# Patient Record
Sex: Male | Born: 1945 | Race: White | Hispanic: No | Smoking: Former smoker
Health system: Southern US, Community
[De-identification: ages and names within clinical notes are randomized; demographics above are authoritative.]

## PROBLEM LIST (undated history)

## (undated) DIAGNOSIS — C61 Malignant neoplasm of prostate: Secondary | ICD-10-CM

## (undated) DIAGNOSIS — E119 Type 2 diabetes mellitus without complications: Secondary | ICD-10-CM

## (undated) DIAGNOSIS — I10 Essential (primary) hypertension: Secondary | ICD-10-CM

---

## 2004-06-19 ENCOUNTER — Emergency Department: Payer: Self-pay | Admitting: Emergency Medicine

## 2004-06-19 ENCOUNTER — Emergency Department: Payer: Self-pay | Admitting: Otolaryngology

## 2004-06-19 ENCOUNTER — Inpatient Hospital Stay: Payer: Self-pay | Admitting: Otolaryngology

## 2015-05-14 ENCOUNTER — Inpatient Hospital Stay
Admission: EM | Admit: 2015-05-14 | Discharge: 2015-05-16 | DRG: 571 | Disposition: A | Discharge status: Home / Self Care | Payer: PRIVATE HEALTH INSURANCE | Attending: Internal Medicine | Admitting: Internal Medicine

## 2015-05-14 ENCOUNTER — Encounter: Payer: Self-pay | Admitting: Emergency Medicine

## 2015-05-14 ENCOUNTER — Emergency Department: Payer: PRIVATE HEALTH INSURANCE

## 2015-05-14 ENCOUNTER — Ambulatory Visit
Admission: EM | Admit: 2015-05-14 | Discharge: 2015-05-14 | Disposition: A | Discharge status: Other Acute Inpatient Hospital | Payer: PRIVATE HEALTH INSURANCE

## 2015-05-14 DIAGNOSIS — L03115 Cellulitis of right lower limb: Principal | ICD-10-CM | POA: Diagnosis present

## 2015-05-14 DIAGNOSIS — L97509 Non-pressure chronic ulcer of other part of unspecified foot with unspecified severity: Secondary | ICD-10-CM | POA: Diagnosis not present

## 2015-05-14 DIAGNOSIS — E11628 Type 2 diabetes mellitus with other skin complications: Secondary | ICD-10-CM

## 2015-05-14 DIAGNOSIS — E118 Type 2 diabetes mellitus with unspecified complications: Secondary | ICD-10-CM

## 2015-05-14 DIAGNOSIS — B951 Streptococcus, group B, as the cause of diseases classified elsewhere: Secondary | ICD-10-CM | POA: Diagnosis present

## 2015-05-14 DIAGNOSIS — E11621 Type 2 diabetes mellitus with foot ulcer: Secondary | ICD-10-CM | POA: Diagnosis not present

## 2015-05-14 DIAGNOSIS — I10 Essential (primary) hypertension: Secondary | ICD-10-CM | POA: Diagnosis present

## 2015-05-14 DIAGNOSIS — Z79899 Other long term (current) drug therapy: Secondary | ICD-10-CM | POA: Diagnosis not present

## 2015-05-14 DIAGNOSIS — E13628 Other specified diabetes mellitus with other skin complications: Secondary | ICD-10-CM | POA: Diagnosis present

## 2015-05-14 DIAGNOSIS — L02611 Cutaneous abscess of right foot: Secondary | ICD-10-CM | POA: Diagnosis present

## 2015-05-14 DIAGNOSIS — L03119 Cellulitis of unspecified part of limb: Secondary | ICD-10-CM

## 2015-05-14 DIAGNOSIS — S91309A Unspecified open wound, unspecified foot, initial encounter: Secondary | ICD-10-CM

## 2015-05-14 DIAGNOSIS — M2010 Hallux valgus (acquired), unspecified foot: Secondary | ICD-10-CM | POA: Diagnosis present

## 2015-05-14 DIAGNOSIS — E119 Type 2 diabetes mellitus without complications: Secondary | ICD-10-CM | POA: Diagnosis present

## 2015-05-14 DIAGNOSIS — Z8546 Personal history of malignant neoplasm of prostate: Secondary | ICD-10-CM

## 2015-05-14 DIAGNOSIS — L03031 Cellulitis of right toe: Secondary | ICD-10-CM

## 2015-05-14 DIAGNOSIS — I891 Lymphangitis: Secondary | ICD-10-CM | POA: Diagnosis present

## 2015-05-14 DIAGNOSIS — Z87891 Personal history of nicotine dependence: Secondary | ICD-10-CM

## 2015-05-14 DIAGNOSIS — L539 Erythematous condition, unspecified: Secondary | ICD-10-CM

## 2015-05-14 DIAGNOSIS — L02619 Cutaneous abscess of unspecified foot: Secondary | ICD-10-CM

## 2015-05-14 HISTORY — DX: Malignant neoplasm of prostate: C61

## 2015-05-14 HISTORY — DX: Essential (primary) hypertension: I10

## 2015-05-14 HISTORY — DX: Type 2 diabetes mellitus without complications: E11.9

## 2015-05-14 LAB — CBC WITH DIFFERENTIAL/PLATELET
BASOS ABS: 0.1 10*3/uL (ref 0–0.1)
BASOS PCT: 1 %
EOS ABS: 0.2 10*3/uL (ref 0–0.7)
EOS PCT: 2 %
HCT: 42 % (ref 40.0–52.0)
Hemoglobin: 14.7 g/dL (ref 13.0–18.0)
Lymphocytes Relative: 21 %
Lymphs Abs: 1.8 10*3/uL (ref 1.0–3.6)
MCH: 34.5 pg — AB (ref 26.0–34.0)
MCHC: 35 g/dL (ref 32.0–36.0)
MCV: 98.4 fL (ref 80.0–100.0)
Monocytes Absolute: 0.9 10*3/uL (ref 0.2–1.0)
Monocytes Relative: 11 %
Neutro Abs: 5.7 10*3/uL (ref 1.4–6.5)
Neutrophils Relative %: 65 %
PLATELETS: 217 10*3/uL (ref 150–440)
RBC: 4.27 MIL/uL — AB (ref 4.40–5.90)
RDW: 12.2 % (ref 11.5–14.5)
WBC: 8.6 10*3/uL (ref 3.8–10.6)

## 2015-05-14 LAB — BASIC METABOLIC PANEL
ANION GAP: 10 (ref 5–15)
BUN: 8 mg/dL (ref 6–20)
CO2: 27 mmol/L (ref 22–32)
Calcium: 9.5 mg/dL (ref 8.9–10.3)
Chloride: 100 mmol/L — ABNORMAL LOW (ref 101–111)
Creatinine, Ser: 0.6 mg/dL — ABNORMAL LOW (ref 0.61–1.24)
GFR calc non Af Amer: >60 mL/min (ref >60)
Glucose, Bld: 170 mg/dL — ABNORMAL HIGH (ref 65–99)
POTASSIUM: 4.6 mmol/L (ref 3.5–5.1)
SODIUM: 137 mmol/L (ref 135–145)

## 2015-05-14 LAB — PROTIME-INR
INR: 1.08
Prothrombin Time: 14.2 seconds (ref 11.4–15.0)

## 2015-05-14 LAB — GLUCOSE, CAPILLARY: GLUCOSE-CAPILLARY: 176 mg/dL — AB (ref 65–99)

## 2015-05-14 LAB — HEMOGLOBIN A1C: Hgb A1c MFr Bld: 5.8 % (ref 4.0–6.0)

## 2015-05-14 LAB — APTT: APTT: 28 s (ref 24–36)

## 2015-05-14 MED ORDER — AMLODIPINE BESYLATE 10 MG PO TABS
10.0000 mg | ORAL_TABLET | Freq: Every day | ORAL | Status: DC
  Administered 2015-05-14: 10 mg via ORAL
  Filled 2015-05-14: qty 1

## 2015-05-14 MED ORDER — ONDANSETRON HCL 4 MG/2ML IJ SOLN
4.0000 mg | Freq: Four times a day (QID) | INTRAMUSCULAR | Status: DC | PRN
Start: 2015-05-14 — End: 2015-05-16

## 2015-05-14 MED ORDER — BISACODYL 10 MG RE SUPP: 10.0000 mg | Freq: Every day | RECTAL | Status: DC | PRN

## 2015-05-14 MED ORDER — VANCOMYCIN HCL 10 G IV SOLR
1500.0000 mg | Freq: Once | INTRAVENOUS | Status: AC
  Administered 2015-05-14: 1500 mg via INTRAVENOUS
  Filled 2015-05-14: qty 1500

## 2015-05-14 MED ORDER — IOHEXOL 300 MG/ML  SOLN
100.0000 mL | Freq: Once | INTRAMUSCULAR | Status: AC | PRN
  Administered 2015-05-14: 100 mL via INTRAVENOUS

## 2015-05-14 MED ORDER — HEPARIN SODIUM (PORCINE) 5000 UNIT/ML IJ SOLN
5000.0000 [IU] | Freq: Three times a day (TID) | INTRAMUSCULAR | Status: DC
  Administered 2015-05-14 – 2015-05-16 (×5): 5000 [IU] via SUBCUTANEOUS
  Filled 2015-05-14 (×5): qty 1

## 2015-05-14 MED ORDER — CLINDAMYCIN PHOSPHATE 600 MG/50ML IV SOLN
600.0000 mg | Freq: Once | INTRAVENOUS | Status: AC
  Administered 2015-05-14: 600 mg via INTRAVENOUS
  Filled 2015-05-14: qty 50

## 2015-05-14 MED ORDER — PIPERACILLIN-TAZOBACTAM 3.375 G IVPB
3.3750 g | Freq: Once | INTRAVENOUS | Status: DC
  Administered 2015-05-14: 3.375 g via INTRAVENOUS
  Filled 2015-05-14: qty 50

## 2015-05-14 MED ORDER — ACETAMINOPHEN 325 MG PO TABS: 650.0000 mg | ORAL_TABLET | Freq: Four times a day (QID) | ORAL | Status: DC | PRN

## 2015-05-14 MED ORDER — BENAZEPRIL HCL 20 MG PO TABS
40.0000 mg | ORAL_TABLET | Freq: Every day | ORAL | Status: DC
Start: 2015-05-14 — End: 2015-05-14

## 2015-05-14 MED ORDER — INSULIN ASPART 100 UNIT/ML ~~LOC~~ SOLN
0.0000 [IU] | Freq: Three times a day (TID) | SUBCUTANEOUS | Status: DC
  Administered 2015-05-15: 2 [IU] via SUBCUTANEOUS
  Filled 2015-05-14: qty 2

## 2015-05-14 MED ORDER — HYDROCODONE-ACETAMINOPHEN 5-325 MG PO TABS: 1.0000 | ORAL_TABLET | ORAL | Status: DC | PRN

## 2015-05-14 MED ORDER — CARVEDILOL 25 MG PO TABS
25.0000 mg | ORAL_TABLET | Freq: Two times a day (BID) | ORAL | Status: DC
  Administered 2015-05-14 – 2015-05-16 (×4): 25 mg via ORAL
  Filled 2015-05-14 (×4): qty 1

## 2015-05-14 MED ORDER — VANCOMYCIN HCL 10 G IV SOLR
1250.0000 mg | Freq: Three times a day (TID) | INTRAVENOUS | Status: DC
  Administered 2015-05-15: 1250 mg via INTRAVENOUS
  Filled 2015-05-14 (×4): qty 1250

## 2015-05-14 MED ORDER — MORPHINE SULFATE (PF) 2 MG/ML IV SOLN: 2.0000 mg | INTRAVENOUS | Status: DC | PRN

## 2015-05-14 MED ORDER — ASPIRIN EC 81 MG PO TBEC
81.0000 mg | DELAYED_RELEASE_TABLET | Freq: Every day | ORAL | Status: DC
  Administered 2015-05-15 – 2015-05-16 (×2): 81 mg via ORAL
  Filled 2015-05-14 (×2): qty 1

## 2015-05-14 MED ORDER — METFORMIN HCL 500 MG PO TABS
500.0000 mg | ORAL_TABLET | Freq: Two times a day (BID) | ORAL | Status: DC
  Administered 2015-05-14 – 2015-05-16 (×4): 500 mg via ORAL
  Filled 2015-05-14 (×4): qty 1

## 2015-05-14 MED ORDER — PIPERACILLIN-TAZOBACTAM 3.375 G IVPB
3.3750 g | Freq: Three times a day (TID) | INTRAVENOUS | Status: DC
  Administered 2015-05-14 – 2015-05-16 (×5): 3.375 g via INTRAVENOUS
  Filled 2015-05-14 (×7): qty 50

## 2015-05-14 MED ORDER — HYDRALAZINE HCL 50 MG PO TABS
50.0000 mg | ORAL_TABLET | Freq: Three times a day (TID) | ORAL | Status: DC
  Administered 2015-05-14: 50 mg via ORAL
  Filled 2015-05-14 (×2): qty 1

## 2015-05-14 MED ORDER — ONDANSETRON HCL 4 MG PO TABS: 4.0000 mg | ORAL_TABLET | Freq: Four times a day (QID) | ORAL | Status: DC | PRN

## 2015-05-14 MED ORDER — GLIPIZIDE 5 MG PO TABS
10.0000 mg | ORAL_TABLET | Freq: Every day | ORAL | Status: DC
  Administered 2015-05-15 – 2015-05-16 (×2): 10 mg via ORAL
  Filled 2015-05-14 (×2): qty 2

## 2015-05-14 MED ORDER — SODIUM CHLORIDE 0.9 % IV SOLN
INTRAVENOUS | Status: DC
  Administered 2015-05-14 – 2015-05-15 (×2): via INTRAVENOUS

## 2015-05-14 MED ORDER — DOCUSATE SODIUM 100 MG PO CAPS
100.0000 mg | ORAL_CAPSULE | Freq: Two times a day (BID) | ORAL | Status: DC
  Administered 2015-05-14 – 2015-05-16 (×4): 100 mg via ORAL
  Filled 2015-05-14 (×4): qty 1

## 2015-05-14 MED ORDER — ACETAMINOPHEN 650 MG RE SUPP: 650.0000 mg | Freq: Four times a day (QID) | RECTAL | Status: DC | PRN

## 2015-05-14 NOTE — Progress Notes (Signed)
Patient states that "he doesn't take aspirin anymore". Called pharmacy to have it d/c'd, also pt would like to have his glipizide in the morning. Corene Cornea in [place] said he would take care of patient's medication requests.

## 2015-05-14 NOTE — ED Provider Notes (Signed)
CSN: [id number]     Arrival date & time 05/14/15  [id number] History   None   Nurses notes were reviewed. Chief Complaint  Patient presents with  . Toe Pain   Patient is a 69 year old diabetic who comes in because of pain in the right middle toe. He states the foot and toe he noticed were red last night. He states he removed a piece of his toenail off the toe this morning that was digging into the toe. His daughter is here and she states that yesterday was the first time she had heard that he had a problem with the toe and the foot. She states she saw was red but has gotten worse since last night. He is a diabetic and is on metformin. States that he does see his doctor about every 4 months. He has also history of prostate cancer as well.    (Consider location/radiation/quality/duration/timing/severity/associated sxs/prior Treatment) Patient is a 69 y.o. male presenting with toe pain. The history is provided by the patient. No language interpreter was used.  Toe Pain This is a new problem. The current episode started yesterday. The problem occurs constantly. The problem has been rapidly worsening. Pertinent negatives include no chest pain, no abdominal pain and no headaches. Nothing relieves the symptoms. He has tried nothing (Self removal of the toenail was going into the toe) for the symptoms. The treatment provided no relief.    Past Medical History  Diagnosis Date  . Diabetes ([place])   . Prostate cancer ([place])    No past surgical history on file. No family history on file. Social History  Substance Use Topics  . Smoking status: Not on file  . Smokeless tobacco: Not on file  . Alcohol Use: Not on file    Review of Systems  Unable to perform ROS: Acuity of condition  Cardiovascular: Negative for chest pain.  Gastrointestinal: Negative for abdominal pain.  Neurological: Negative for headaches.    Allergies  Review of patient's allergies indicates no known allergies.  Home Medications    Prior to Admission medications   Medication Sig Start Date End Date Taking? Authorizing Provider  metformin (FORTAMET) 1000 MG (OSM) 24 hr tablet Take 1,000 mg by mouth daily with breakfast.   Yes Historical Provider, MD   Meds Ordered and Administered this Visit  Medications - No data to display  There were no vitals taken for this visit. No data found.   Physical Exam  Constitutional: He appears well-developed and well-nourished. No distress.  HENT:  Head: Normocephalic and atraumatic.  Eyes: Conjunctivae are normal. Pupils are equal, round, and reactive to light.  Musculoskeletal: Normal range of motion. He exhibits edema and tenderness.       Right foot: There is tenderness and swelling.       Feet:  There is ulcerations on the right middle toe at the distal part of the nail bed. This pus draining from the area the right toe was markedly swollen and edematous and redness extends from the toe into the foot streaks go up the right leg to the knee. Patient is afebrile but obvious bad toe infection with subsequent cellulitis  Neurological: He is alert.  Skin: He is not diaphoretic. There is erythema.  Psychiatric: He has a normal mood and affect. His behavior is normal.    ED Course  Procedures (including critical care time)  Labs Review Labs Reviewed - No data to display  Imaging Review No results found.   Visual Acuity Review  Right Eye Distance:   Left Eye Distance:   Bilateral Distance:    Right Eye Near:   Left Eye Near:    Bilateral Near:         MDM   1. Cellulitis of middle toe, right   2. Cellulitis of leg, right   3. Type 2 diabetes mellitus with foot ulcer, without [place]-term current use of insulin ([place])     I've discussed with [facility] charge nurse Elenore Rota of patient's imminent arrival. I've informed patient and his daughter that he will probably need blood cultures at least 1 dose of IV antibiotics if not multiple and may require hospitalization. We  did offer to see start the evaluation here but I did also explain to them was a good chance that he was to be sent to the ED. They've opted to go on to the ED     Frederich Cha, MD 05/14/15 1143

## 2015-05-14 NOTE — ED Notes (Signed)
Notified Pharmacy of vancomycin; Will send to ER

## 2015-05-14 NOTE — ED Notes (Signed)
Patient complains of right foot 2nd toe pain. Patient toe is red/purple discoloration. He states that he didn't realize his toe was this bad til last night. He states that toe is painful. Erythema has spread up foot and leg.

## 2015-05-14 NOTE — ED Notes (Signed)
Pt transported to CT ?

## 2015-05-14 NOTE — Consult Note (Signed)
ORTHOPAEDIC CONSULTATION  REQUESTING PHYSICIAN: Hillary Bow, MD  Chief Complaint: Cellulitis right foot with abscess right second toe  HPI: Cory Anderson is a 69 y.o. male who complains of  infection to his right leg. Yesterday he noticed redness extending up his leg. He believes he had an ingrown nail he pulled some of the nail and skin off the tip of the toe today. Was seen in urgent care today and subsequently referred to the emergency room. A time the noted lymphangitis and abscess of the tip of the second toe. He has approximately a 20 year history  diabetes.  Past Medical History  Diagnosis Date  . Diabetes ([place])   . Hypertension   . Prostate cancer [facility])    History reviewed. No pertinent past surgical history. Social History   Social History  . Marital Status: Widowed    Spouse Name: N/A  . Number of Children: N/A  . Years of Education: N/A   Social History Main Topics  . Smoking status: Former Research scientist (life sciences)  . Smokeless tobacco: None  . Alcohol Use: Yes  . Drug Use: None  . Sexual Activity: Not Asked   Other Topics Concern  . None   Social History Narrative   History reviewed. No pertinent family history. No Known Allergies Prior to Admission medications   Medication Sig Start Date End Date Taking? Authorizing Provider  amLODipine (NORVASC) 10 MG tablet Take 10 mg by mouth daily.   Yes Historical Provider, MD  benazepril (LOTENSIN) 40 MG tablet Take 40 mg by mouth daily.   Yes Historical Provider, MD  carvedilol (COREG) 25 MG tablet Take 25 mg by mouth 2 (two) times daily with a meal.   Yes Historical Provider, MD  glipiZIDE (GLUCOTROL) 10 MG tablet Take 10 mg by mouth daily before breakfast.   Yes Historical Provider, MD  hydrALAZINE (APRESOLINE) 50 MG tablet Take 50 mg by mouth 3 (three) times daily.   Yes Historical Provider, MD  metFORMIN (GLUCOPHAGE) 500 MG tablet Take 500 mg by mouth 2 (two) times daily with a meal.   Yes Historical Provider, MD  metformin  (FORTAMET) 1000 MG (OSM) 24 hr tablet Take 1,000 mg by mouth daily with breakfast.    Historical Provider, MD   Ct Tibia Fibula Right W Contrast  05/14/2015  CLINICAL DATA:  Redness. EXAM: CT OF THE LOWER RIGHT EXTREMITY WITH CONTRAST TECHNIQUE: Multidetector CT imaging of the right lower leg was performed according to the standard protocol following intravenous contrast administration. COMPARISON:  None. CONTRAST:  141mL OMNIPAQUE IOHEXOL 300 MG/ML  SOLN FINDINGS: Vascular calcifications are noted. No fracture or dislocation is noted. No lytic destruction is noted to suggest osteomyelitis. Degenerative changes are seen involving the first metatarsophalangeal joint. No abnormal fluid collection is noted. Mild stranding of subcutaneous tissues is noted concerning for edema or inflammation. IMPRESSION: No fracture or dislocation is seen involving the right tibia or fibula. No lytic destruction is seen to suggest osteomyelitis. Mild stranding of subcutaneous tissues in distal lower leg are noted concerning for edema or inflammation. Electronically Signed   By: Marijo Conception, M.D.   On: 05/14/2015 15:21   Dg Foot Complete Right  05/14/2015  CLINICAL DATA:  Ulcerated wound second toe. Pain and redness distal foot EXAM: RIGHT FOOT COMPLETE - 3+ VIEW COMPARISON:  None. FINDINGS: Frontal, oblique, and lateral views were obtained. There is no demonstrable fracture or dislocation. There is no erosive change or bony destruction. No soft tissue abscess is appreciable. There  is bony overgrowth of the distal first metatarsal with bunion formation. There is hallux valgus deformity at the first MTP joint. There is osteoarthritic change in the first MTP joint. There is also osteoarthritic change in the medial cuneiform -first metatarsal joint. There is mild spurring in the dorsal midfoot. There are spurs arising from the posterior inferior calcaneus. 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 Hallux valgus deformity at the first MTP joint with bunion formation in this area. No fracture or dislocation. No erosive change or bony destruction. No demonstrable soft tissue abscess. There is soft tissue swelling over the dorsal forefoot. Electronically Signed   By: Lowella Grip III M.D.   On: 05/14/2015 13:18    Positive ROS: All other systems have been reviewed and were otherwise negative with the exception of those mentioned in the HPI and as above.  12 point ROS was performed.  Physical Exam: General: Alert and oriented.  No apparent distress.  Vascular:  Left foot:Dorsalis Pedis:  present Posterior Tibial:  present  Right foot: Dorsalis Pedis:  present Posterior Tibial:  present  Neuro:absent protective sensation. His station is absent to the toes. Intact From the mid foot proximal.  Derm: The tip of the right second toe has a noted open ulceration on the very end of it. Thick fibrotic tissue on the tip of the second toe. There is a scant amount of purulent drainage from this area as well as a smaller open abscess area dorsally on the second toe. There is noted erythema diffusely to the second toe with cellulitis dorsally to the midfoot. Lymphangitis is noted to the medial aspect of the calf to the mid tibial region on the right side. The ulcerative site itself does probe directly to bone. There is no loose bone within the area. I was able to debride with a 15 blade through the epidermis and dermal layer down to subcutaneous tissue. I did not debride the bone. This was an excisional type of debridement. Surgery itself measures 3 mm in diameter. It was thickened nonviable fibrotic tissue in the central aspect.  Ortho/MS: Edema is noted to the right forefoot. Valgus deformity noted. No severe hammertoe contracture of the second toe on the right foot.  X-rays: I reviewed the x-rays that shows no obvious erosive  changes.  CBC Latest Ref Rng 05/14/2015  WBC 3.8 - 10.6 K/uL 8.6  Hemoglobin 13.0 - 18.0 g/dL 14.7  Hematocrit 40.0 - 52.0 % 42.0  Platelets 150 - 440 K/uL 217    Lab Results  Component Value Date   HGBA1C 5.8 05/14/2015      Assessment: Abscess tip of the right second toe. Concerning for osteomyelitis with no x-ray erosive changes.  Plan: Excisional debridement was performed as stated above. Wound culture was also taken.  Applied a bandaged to the area. We'll await cultures. Suspect he can be monitored outpatient once cultures and sensitivities are obtained. I did discuss with the patient he is at high risk of osteomyelitis given the fact he has a draining abscessed area that probes to bone. As time there is no radiographic findings of osteomyelitis on either CT or x-ray imaging.    Elesa Hacker, DPM Cell [phone]   05/14/2015 7:59 PM

## 2015-05-14 NOTE — Discharge Instructions (Signed)
Cellulitis °Cellulitis is an infection of the skin and the tissue under the skin. The infected area is usually red and tender. This happens most often in the arms and lower legs. °HOME CARE  °· Take your antibiotic medicine as told. Finish the medicine even if you start to feel better. °· Keep the infected arm or leg raised (elevated). °· Put a warm cloth on the area up to 4 times per day. °· Only take medicines as told by your doctor. °· Keep all doctor visits as told. °GET HELP IF: °· You see red streaks on the skin coming from the infected area. °· Your red area gets bigger or turns a dark color. °· Your bone or joint under the infected area is painful after the skin heals. °· Your infection comes back in the same area or different area. °· You have a puffy (swollen) bump in the infected area. °· You have new symptoms. °· You have a fever. °GET HELP RIGHT AWAY IF:  °· You feel very sleepy. °· You throw up (vomit) or have watery poop (diarrhea). °· You feel sick and have muscle aches and pains. °  °This information is not intended to replace advice given to you by your health care provider. Make sure you discuss any questions you have with your health care provider. °  °Document Released: 11/06/2007 Document Revised: 02/08/2015 Document Reviewed: 08/05/2011 °Elsevier Interactive Patient Education ©2016 [employer] ° °

## 2015-05-14 NOTE — H&P (Signed)
History and Physical    Cory Anderson [id number] DOB: 24-Nov-1945 DOA: 05/14/2015  Referring physician: Dr. Jacqualine Code PCP: [facility]  Specialists: none  Chief Complaint: right foot pain  HPI: Cory Anderson is a 69 y.o. male has a past medical history significant for HTN, DM, and prostate CA who presents with progressive right foot/toe pain. Has known DM. In ER, pt noted to have blistering wound to right second toe with erythema tracking up the right leg. CT suggests cellulitis without osteomyelitis. He is now admitted.  Review of Systems: The patient denies anorexia, fever, weight loss,, vision loss, decreased hearing, hoarseness, chest pain, syncope, dyspnea on exertion, peripheral edema, balance deficits, hemoptysis, abdominal pain, melena, hematochezia, severe indigestion/heartburn, hematuria, incontinence, genital sores, muscle weakness, suspicious skin lesions, transient blindness, difficulty walking, depression, unusual weight change, abnormal bleeding, enlarged lymph nodes, angioedema, and breast masses.   Past Medical History  Diagnosis Date  . Diabetes ([place])   . Hypertension   . Prostate cancer [facility])    History reviewed. No pertinent past surgical history. Social History:  reports that he has quit smoking. He does not have any smokeless tobacco history on file. He reports that he drinks alcohol. His drug history is not on file.  No Known Allergies  History reviewed. No pertinent family history.  Prior to Admission medications   Medication Sig Start Date End Date Taking? Authorizing Provider  amLODipine (NORVASC) 10 MG tablet Take 10 mg by mouth daily.   Yes Historical Provider, MD  benazepril (LOTENSIN) 40 MG tablet Take 40 mg by mouth daily.   Yes Historical Provider, MD  carvedilol (COREG) 25 MG tablet Take 25 mg by mouth 2 (two) times daily with a meal.   Yes Historical Provider, MD  glipiZIDE (GLUCOTROL) 10 MG tablet Take 10 mg by mouth daily  before breakfast.   Yes Historical Provider, MD  hydrALAZINE (APRESOLINE) 50 MG tablet Take 50 mg by mouth 3 (three) times daily.   Yes Historical Provider, MD  metFORMIN (GLUCOPHAGE) 500 MG tablet Take 500 mg by mouth 2 (two) times daily with a meal.   Yes Historical Provider, MD  metformin (FORTAMET) 1000 MG (OSM) 24 hr tablet Take 1,000 mg by mouth daily with breakfast.    Historical Provider, MD   Physical Exam: Filed Vitals:   05/14/15 1400 05/14/15 1430 05/14/15 1445 05/14/15 1500  BP: 119/68 125/76  124/66  Pulse: 89 34 59 86  Temp:      TempSrc:      Resp:      Height:      Weight:      SpO2: 96% 97% 95% 97%     General:  No apparent distress  Eyes: PERRL, EOMI, no scleral icterus  ENT: moist oropharynx  Neck: supple, no lymphadenopathy  Cardiovascular: regular rate without MRG; 2+ peripheral pulses, no JVD, no peripheral edema  Respiratory: CTA biL, good air movement without wheezing, rhonchi or crackled  Abdomen: soft, non tender to palpation, positive bowel sounds, no guarding, no rebound  Skin: blistering wound to right second toe with purulence and erythema and tenderness. Erythema extends up right leg to 2 inches below knee.  Musculoskeletal: normal bulk and tone, no joint swelling  Psychiatric: normal mood and affect  Neurologic: CN 2-12 grossly intact, MS 5/5 in all 4  Labs on Admission:  Basic Metabolic Panel:  Recent Labs Lab 05/14/15 1220  NA 137  K 4.6  CL 100*  CO2 27  GLUCOSE  170*  BUN 8  CREATININE 0.60*  CALCIUM 9.5   Liver Function Tests: No results for input(s): AST, ALT, ALKPHOS, BILITOT, PROT, ALBUMIN in the last 168 hours. No results for input(s): LIPASE, AMYLASE in the last 168 hours. No results for input(s): AMMONIA in the last 168 hours. CBC:  Recent Labs Lab 05/14/15 1220  WBC 8.6  NEUTROABS 5.7  HGB 14.7  HCT 42.0  MCV 98.4  PLT 217   Cardiac Enzymes: No results for input(s): CKTOTAL, CKMB, CKMBINDEX, TROPONINI  in the last 168 hours.  BNP (last 3 results) No results for input(s): BNP in the last 8760 hours.  ProBNP (last 3 results) No results for input(s): PROBNP in the last 8760 hours.  CBG: No results for input(s): GLUCAP in the last 168 hours.  Radiological Exams on Admission: Ct Tibia Fibula Right W Contrast  05/14/2015  CLINICAL DATA:  Redness. EXAM: CT OF THE LOWER RIGHT EXTREMITY WITH CONTRAST TECHNIQUE: Multidetector CT imaging of the right lower leg was performed according to the standard protocol following intravenous contrast administration. COMPARISON:  None. CONTRAST:  154mL OMNIPAQUE IOHEXOL 300 MG/ML  SOLN FINDINGS: Vascular calcifications are noted. No fracture or dislocation is noted. No lytic destruction is noted to suggest osteomyelitis. Degenerative changes are seen involving the first metatarsophalangeal joint. No abnormal fluid collection is noted. Mild stranding of subcutaneous tissues is noted concerning for edema or inflammation. IMPRESSION: No fracture or dislocation is seen involving the right tibia or fibula. No lytic destruction is seen to suggest osteomyelitis. Mild stranding of subcutaneous tissues in distal lower leg are noted concerning for edema or inflammation. Electronically Signed   By: Marijo Conception, M.D.   On: 05/14/2015 15:21   Dg Foot Complete Right  05/14/2015  CLINICAL DATA:  Ulcerated wound second toe. Pain and redness distal foot EXAM: RIGHT FOOT COMPLETE - 3+ VIEW COMPARISON:  None. FINDINGS: Frontal, oblique, and lateral views were obtained. There is no demonstrable fracture or dislocation. There is no erosive change or bony destruction. No soft tissue abscess is appreciable. There is bony overgrowth of the distal first metatarsal with bunion formation. There is hallux valgus deformity at the first MTP joint. There is osteoarthritic change in the first MTP joint. There is also osteoarthritic change in the medial cuneiform -first metatarsal joint. There is  mild spurring in the dorsal midfoot. There are spurs arising from the posterior inferior calcaneus. 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 Hallux valgus deformity at the first MTP joint with bunion formation in this area. No fracture or dislocation. No erosive change or bony destruction. No demonstrable soft tissue abscess. There is soft tissue swelling over the dorsal forefoot. Electronically Signed   By: Lowella Grip III M.D.   On: 05/14/2015 13:18    EKG: Independently reviewed.  Assessment/Plan Principal Problem:   Cellulitis and abscess of foot Active Problems:   Wound, open, foot   Type II diabetes mellitus with manifestations ([place])   Cultures sent. Will admit to floor with IV fluids and IV ABX. Follow sugars. Consult Podiatry.  Diet: Carb modified Fluids: NS@100  DVT Prophylaxis: SQ Heparin  Code Status: FULL  Family Communication: yes  Disposition Plan: home  Time spent: 50 min

## 2015-05-14 NOTE — ED Notes (Signed)
Pt to ED with c/o pain, redness, ulcerated wound and swelling to right 2nd toe x 1 week, states he got worse last night, also has redness going up right leg, pt is a diabetic, states he thinks this started as a ingrown toenail

## 2015-05-14 NOTE — ED Provider Notes (Signed)
[facility] Emergency Department Provider Note REMINDER - THIS NOTE IS NOT A FINAL MEDICAL RECORD UNTIL IT IS SIGNED. UNTIL THEN, THE CONTENT BELOW MAY REFLECT INFORMATION FROM A DOCUMENTATION TEMPLATE, NOT THE ACTUAL PATIENT VISIT. ____________________________________________  Time seen: Approximately 1:33 PM  I have reviewed the triage vital signs and the nursing notes.   HISTORY  Chief Complaint Foot Pain and Cellulitis    HPI Cory Anderson is a 69 y.o. male presents for evaluation of right foot swelling. Patient reported for about the last week he had some small ulcer at the tip of his right second toe, however yesterday started no severe increase in redness which has today now tracked up to the mid leg. He denies pain, does status some mild tingling in his toes due to his diabetes.  No fevers or chills. No chest pain or trouble breathing. Denies any other concerns.   Past Medical History  Diagnosis Date  . Diabetes ([place])   . Prostate cancer ([place])   . Hypertension     There are no active problems to display for this patient.   History reviewed. No pertinent past surgical history.  Current Outpatient Rx  Name  Route  Sig  Dispense  Refill  . amLODipine (NORVASC) 10 MG tablet   Oral   Take 10 mg by mouth daily.         . benazepril (LOTENSIN) 40 MG tablet   Oral   Take 40 mg by mouth daily.         . carvedilol (COREG) 25 MG tablet   Oral   Take 25 mg by mouth 2 (two) times daily with a meal.         . glipiZIDE (GLUCOTROL) 10 MG tablet   Oral   Take 10 mg by mouth daily before breakfast.         . hydrALAZINE (APRESOLINE) 50 MG tablet   Oral   Take 50 mg by mouth 3 (three) times daily.         . metFORMIN (GLUCOPHAGE) 500 MG tablet   Oral   Take 500 mg by mouth 2 (two) times daily with a meal.         . metformin (FORTAMET) 1000 MG (OSM) 24 hr tablet   Oral   Take 1,000 mg by mouth daily with breakfast.            Allergies Review of patient's allergies indicates no known allergies.  No family history on file.  Social History Social History  Substance Use Topics  . Smoking status: Former Research scientist (life sciences)  . Smokeless tobacco: None  . Alcohol Use: Yes    Review of Systems Constitutional: No fever/chills Eyes: No visual changes. ENT: No sore throat. Cardiovascular: Denies chest pain. Respiratory: Denies shortness of breath. Gastrointestinal: No abdominal pain.  No nausea, no vomiting.  No diarrhea.  No constipation. Genitourinary: Negative for dysuria. Musculoskeletal: Negative for back pain. Skin: See history of present illness Neurological: Negative for headaches, focal weakness or numbness.  10-point ROS otherwise negative.  ____________________________________________   PHYSICAL EXAM:  VITAL SIGNS: ED Triage Vitals  Enc Vitals Group     BP 05/14/15 1209 124/58 mmHg     Pulse Rate 05/14/15 1209 86     Resp 05/14/15 1209 18     Temp 05/14/15 1209 97.5 F (36.4 C)     Temp Source 05/14/15 1209 Oral     SpO2 05/14/15 1209 99 %     Weight  05/14/15 1215 184 lb (83.462 kg)     Height 05/14/15 1215 5\' 11"  (1.803 m)     Head Cir --      Peak Flow --      Pain Score 05/14/15 1217 0     Pain Loc --      Pain Edu? --      Excl. in [place]? --    Constitutional: Alert and oriented. Well appearing and in no acute distress. Eyes: Conjunctivae are normal. PERRL. EOMI. Head: Atraumatic. Nose: No congestion/rhinnorhea. Mouth/Throat: Mucous membranes are moist.  Oropharynx non-erythematous. Neck: No stridor.   Cardiovascular: Normal rate, regular rhythm. Grossly normal heart sounds.  Good peripheral circulation. Respiratory: Normal respiratory effort.  No retractions. Lungs CTAB. Gastrointestinal: Soft and nontender. No distention. No abdominal bruits. No CVA tenderness. Musculoskeletal:   Lower Extremities  No edema. Normal DP/PT pulses bilateral with good cap refill.  Normal  neuro-motor function lower extremities bilateral.  RIGHT Right lower extremity demonstrates normal strength, good use of all muscles. No edema bruising or contusions of the right hip, right knee, right ankle. Full range of motion of the right lower extremity without pain. No pain on axial loading. No evidence of trauma.  Patient does have notable erythema and edema of the entire right second phalanx, in addition there is red streaking and erythema from approximately the toe into the mid  and then up to the mid right calf. Normal dorsalis pedis and posterior tibial pulses bilaterally  LEFT Left lower extremity demonstrates normal strength, good use of all muscles. No edema bruising or contusions of the hip,  knee, ankle. Full range of motion of the left lower extremity without pain. No pain on axial loading. No evidence of trauma.   Neurologic:  Normal speech and language. No gross focal neurologic deficits are appreciated. No gait instability. Skin:  Skin is warm, dry and intact. No rash noted. Psychiatric: Mood and affect are normal. Speech and behavior are normal.  ____________________________________________   LABS (all labs ordered are listed, but only abnormal results are displayed)  Labs Reviewed  CBC WITH DIFFERENTIAL/PLATELET - Abnormal; Notable for the following:    RBC 4.27 (*)    MCH 34.5 (*)    All other components within normal limits  BASIC METABOLIC PANEL - Abnormal; Notable for the following:    Chloride 100 (*)    Glucose, Bld 170 (*)    Creatinine, Ser 0.60 (*)    All other components within normal limits  CULTURE, BLOOD (ROUTINE X 2)  CULTURE, BLOOD (ROUTINE X 2)  PROTIME-INR  APTT   ____________________________________________  EKG   ____________________________________________  RADIOLOGY  CT Tibia Fibula Right W Contrast (Final result) Result time: 05/14/15 15:21:27   Procedure changed from CT EXTREM LOWER W CM BIL      Final result by Rad  Results In Interface (05/14/15 15:21:27)   Narrative:   CLINICAL DATA: Redness.  EXAM: CT OF THE LOWER RIGHT EXTREMITY WITH CONTRAST  TECHNIQUE: Multidetector CT imaging of the right lower leg was performed according to the standard protocol following intravenous contrast administration.  COMPARISON: None.  CONTRAST: 181mL OMNIPAQUE IOHEXOL 300 MG/ML SOLN  FINDINGS: Vascular calcifications are noted. No fracture or dislocation is noted. No lytic destruction is noted to suggest osteomyelitis. Degenerative changes are seen involving the first metatarsophalangeal joint. No abnormal fluid collection is noted. Mild stranding of subcutaneous tissues is noted concerning for edema or inflammation.  IMPRESSION: No fracture or dislocation is seen involving the right  tibia or fibula. No lytic destruction is seen to suggest osteomyelitis. Mild stranding of subcutaneous tissues in distal lower leg are noted concerning for edema or inflammation.    ____________________________________________   PROCEDURES  Procedure(s) performed: None  Critical Care performed: No  ____________________________________________   INITIAL IMPRESSION / ASSESSMENT AND PLAN / ED COURSE  Pertinent labs & imaging results that were available during my care of the patient were reviewed by me and considered in my medical decision making (see chart for details).  Patient presents with what appears to be severe cellulitis of the right foot, he is a known diabetic. He reports severe increase in streaking and redness her last 24 hours. I am concerned with his exam about the possibility of abscess or free air/anaerobic infection. We'll admit the patient, place him on broad-spectrum IV antibiotics, and obtain CT imaging to evaluate for gas forming organisms. ____________________________________________   FINAL CLINICAL IMPRESSION(S) / ED DIAGNOSES  Final diagnoses:  Redness  Cellulitis in diabetic foot  (HCC)      Delman Kitten, MD 05/14/15 1529

## 2015-05-14 NOTE — Progress Notes (Signed)
ANTIBIOTIC CONSULT NOTE - INITIAL  Pharmacy Consult for Vancomycin & Zosyn Indication: Cellulitis  No Known Allergies  Patient Measurements: Height: 5\' 11"  (180.3 cm) Weight: 184 lb (83.462 kg) IBW/kg (Calculated) : 75.3   Vital Signs: Temp: 97.5 F (36.4 C) (12/11 1215) Temp Source: Oral (12/11 1215) BP: 117/74 mmHg (12/11 1630) Pulse Rate: 73 (12/11 1630) Intake/Output from previous day:   Intake/Output from this shift:    Labs:  Recent Labs  05/14/15 1220  WBC 8.6  HGB 14.7  PLT 217  CREATININE 0.60*   Estimated Creatinine Clearance: 94.1 mL/min (by C-G formula based on Cr of 0.6). No results for input(s): VANCOTROUGH, VANCOPEAK, VANCORANDOM, GENTTROUGH, GENTPEAK, GENTRANDOM, TOBRATROUGH, TOBRAPEAK, TOBRARND, AMIKACINPEAK, AMIKACINTROU, AMIKACIN in the last 72 hours.   Microbiology: No results found for this or any previous visit (from the past 720 hour(s)).  Medical History: Past Medical History  Diagnosis Date  . Diabetes ([place])   . Hypertension   . Prostate cancer ([place])     Medications:  Anti-infectives    Start     Dose/Rate Route Frequency Ordered Stop   05/15/15 0200  vancomycin (VANCOCIN) 1,250 mg in sodium chloride 0.9 % 250 mL IVPB     1,250 mg 166.7 mL/hr over 90 Minutes Intravenous Every 8 hours 05/14/15 1643     05/14/15 2300  piperacillin-tazobactam (ZOSYN) IVPB 3.375 g     3.375 g 12.5 mL/hr over 240 Minutes Intravenous 3 times per day 05/14/15 1643     05/14/15 1345  piperacillin-tazobactam (ZOSYN) IVPB 3.375 g     3.375 g 12.5 mL/hr over 240 Minutes Intravenous  Once 05/14/15 1339     05/14/15 1300  clindamycin (CLEOCIN) IVPB 600 mg     600 mg 100 mL/hr over 30 Minutes Intravenous  Once 05/14/15 1245 05/14/15 1405   05/14/15 1300  vancomycin (VANCOCIN) 1,500 mg in sodium chloride 0.9 % 500 mL IVPB     1,500 mg 250 mL/hr over 120 Minutes Intravenous  Once 05/14/15 1245 05/14/15 1645     Assessment: 69 yo male admitted with  progressive right foot/toe pain with blistering wound to right second toe and erythema tracking up the right leg. HX significant for Diabetes and Hypertension  Goal of Therapy:  Vancomycin trough level 10-15 mcg/ml  Eradication of infection  Plan:  Patient received 1500mg  Vancomcyin in ED today.  Will follow with 1250mg  q12 hr and check trough before 4th dose.  Zosyn 3.375mg  EI ordered q8 hr per renal function.   Shaneka Efaw K 05/14/2015,4:45 PM

## 2015-05-14 NOTE — ED Notes (Signed)
Patient is being sent to [facility] by personal vehicle. Patient may require IV Antibiotics, which we cannot provide here. Patient is being transported by daughter. Dr. Frederich Cha MD advised that hospital evaluation is best option for patient at this current time.

## 2015-05-15 LAB — CBC
HCT: 38.1 % — ABNORMAL LOW (ref 40.0–52.0)
HEMOGLOBIN: 13.5 g/dL (ref 13.0–18.0)
MCH: 34.5 pg — ABNORMAL HIGH (ref 26.0–34.0)
MCHC: 35.3 g/dL (ref 32.0–36.0)
MCV: 97.7 fL (ref 80.0–100.0)
Platelets: 201 10*3/uL (ref 150–440)
RBC: 3.9 MIL/uL — AB (ref 4.40–5.90)
RDW: 12.4 % (ref 11.5–14.5)
WBC: 6.3 10*3/uL (ref 3.8–10.6)

## 2015-05-15 LAB — COMPREHENSIVE METABOLIC PANEL
ALBUMIN: 3.2 g/dL — AB (ref 3.5–5.0)
ALK PHOS: 87 U/L (ref 38–126)
ALT: 11 U/L — AB (ref 17–63)
AST: 17 U/L (ref 15–41)
Anion gap: 7 (ref 5–15)
BUN: 8 mg/dL (ref 6–20)
CALCIUM: 8.8 mg/dL — AB (ref 8.9–10.3)
CO2: 27 mmol/L (ref 22–32)
CREATININE: 0.6 mg/dL — AB (ref 0.61–1.24)
Chloride: 106 mmol/L (ref 101–111)
GFR calc Af Amer: >60 mL/min (ref >60)
GFR calc non Af Amer: >60 mL/min (ref >60)
GLUCOSE: 67 mg/dL (ref 65–99)
Potassium: 3.5 mmol/L (ref 3.5–5.1)
SODIUM: 140 mmol/L (ref 135–145)
Total Bilirubin: 1.1 mg/dL (ref 0.3–1.2)
Total Protein: 6.5 g/dL (ref 6.5–8.1)

## 2015-05-15 LAB — GLUCOSE, CAPILLARY
GLUCOSE-CAPILLARY: 91 mg/dL (ref 65–99)
GLUCOSE-CAPILLARY: 168 mg/dL — AB (ref 65–99)
Glucose-Capillary: 87 mg/dL (ref 65–99)
Glucose-Capillary: 81 mg/dL (ref 65–99)
Glucose-Capillary: 101 mg/dL — ABNORMAL HIGH (ref 65–99)

## 2015-05-15 MED ORDER — VANCOMYCIN HCL 10 G IV SOLR
1250.0000 mg | Freq: Two times a day (BID) | INTRAVENOUS | Status: DC
  Administered 2015-05-15: 1250 mg via INTRAVENOUS
  Filled 2015-05-15 (×2): qty 1250

## 2015-05-15 NOTE — Progress Notes (Signed)
   05/15/15 1336  Clinical Encounter Type  Visited With Patient  Visit Type Initial  Consult/Referral To Chaplain  Stress Factors  Patient Stress Factors None identified  Chaplain rounded in unit and offered pastoral care.   Chaplain Alexis Reber [phone]

## 2015-05-15 NOTE — Progress Notes (Signed)
ANTIBIOTIC CONSULT NOTE - INITIAL  Pharmacy Consult for Vancomycin & Zosyn Indication: Cellulitis  No Known Allergies  Patient Measurements: Height: 5\' 11"  (180.3 cm) Weight: 180 lb 9.2 oz (81.909 kg) IBW/kg (Calculated) : 75.3  Vital Signs: Temp: 98.5 F (36.9 C) (12/12 0721) Temp Source: Oral (12/12 0721) BP: 126/62 mmHg (12/12 0721) Pulse Rate: 79 (12/12 0721) Intake/Output from previous day:   Intake/Output from this shift:    Labs:  Recent Labs  05/14/15 1220 05/15/15 0330  WBC 8.6 6.3  HGB 14.7 13.5  PLT 217 201  CREATININE 0.60* 0.60*   Estimated Creatinine Clearance: 94.1 mL/min (by C-G formula based on Cr of 0.6). No results for input(s): VANCOTROUGH, VANCOPEAK, VANCORANDOM, GENTTROUGH, GENTPEAK, GENTRANDOM, TOBRATROUGH, TOBRAPEAK, TOBRARND, AMIKACINPEAK, AMIKACINTROU, AMIKACIN in the last 72 hours.   Microbiology: Recent Results (from the past 720 hour(s))  Wound culture     Status: None (Preliminary result)   Collection Time: 05/14/15  7:50 PM  Result Value Ref Range Status   Specimen Description WOUND  Final   Special Requests NONE  Final   Gram Stain PENDING  Incomplete   Culture   Final    HEAVY GROWTH BETA HEMOLYTIC ORGANISM BEING IDENTIFIED There is no known Penicillin Resistant Beta Streptococcus in the [place] For patients that are Penicillin-allergic, Erythromycin is 85-94% susceptible, and Clindamycin is 80% susceptible.  Contact Microbiology within 7 days if sensitivity testing is  required.      Report Status PENDING  Incomplete   Medical History: Past Medical History  Diagnosis Date  . Diabetes ([place])   . Hypertension   . Prostate cancer ([place])    Medications:  Anti-infectives    Start     Dose/Rate Route Frequency Ordered Stop   05/15/15 1430  vancomycin (VANCOCIN) 1,250 mg in sodium chloride 0.9 % 250 mL IVPB     1,250 mg 166.7 mL/hr over 90 Minutes Intravenous Every 12 hours 05/15/15 0936     05/15/15 0200  vancomycin (VANCOCIN)  1,250 mg in sodium chloride 0.9 % 250 mL IVPB  Status:  Discontinued     1,250 mg 166.7 mL/hr over 90 Minutes Intravenous Every 8 hours 05/14/15 1643 05/15/15 0935   05/14/15 2300  piperacillin-tazobactam (ZOSYN) IVPB 3.375 g     3.375 g 12.5 mL/hr over 240 Minutes Intravenous 3 times per day 05/14/15 1643     05/14/15 1345  piperacillin-tazobactam (ZOSYN) IVPB 3.375 g  Status:  Discontinued     3.375 g 12.5 mL/hr over 240 Minutes Intravenous  Once 05/14/15 1339 05/14/15 1701   05/14/15 1300  clindamycin (CLEOCIN) IVPB 600 mg     600 mg 100 mL/hr over 30 Minutes Intravenous  Once 05/14/15 1245 05/14/15 1405   05/14/15 1300  vancomycin (VANCOCIN) 1,500 mg in sodium chloride 0.9 % 500 mL IVPB     1,500 mg 250 mL/hr over 120 Minutes Intravenous  Once 05/14/15 1245 05/14/15 1645     Assessment: Pharmacy consulted to dose vancomycin and piperacillin/tazobactam in this 69 year old male admitted with progressive right foot/toe pain with a blistering wound and erythema tracking up right leg. Pharmacy consulted to dose vancomycin for cellulitis. Patient has a PMH significant for diabetes. Imaging does not suggest osteomyelitis. Patient underwent debridement on 12/11 and wound culture was obtained.  Patient is currently on day #2 antibiotics: -piperacillin/tazobactam 3.375 g IV q8h -vancomycin 1500 mg x 1 dose in ED followed by vancomycin 1250 mg IV q8h  Kinetics: Use actual body weight of 81.9 kg  Ke: 0.082 Half-life: ~13 hours CrCl ~ 100 mL/min Cmin (calculated): ~13 mcg/mL  Goal of Therapy:  Vancomycin trough level 10-15 mcg/ml  Eradication of infection  Plan:  Due to patient's age and calculated half-life, will change vancomycin to 1250 mg IV q12h regimen.  Patient only received one dose of the q 8 hour regimen at 0230 this morning (~ 8 hours after initial vancomycin dose given in ED). Will begin q12h dosing at 14:30 this afternoon. Vancomycin trough ordered for 12/13 at 1400, prior to 5th  dose.  Continue piperacillin/tazobactam 3.375mg  q 8 hours EI.  Pharmacy will continue to monitor, thank you for the consult.  Darylene Price Choice Kleinsasser 05/15/2015,9:41 AM

## 2015-05-15 NOTE — Progress Notes (Signed)
[facility] [place] NAME: Cory Anderson    MR#:  [id number]  DATE OF BIRTH:  [birth year]  SUBJECTIVE:  CHIEF COMPLAINT:   Chief Complaint  Patient presents with  . Foot Pain  . Cellulitis   no complaint. Status post surgery of right foot.  REVIEW OF SYSTEMS:  CONSTITUTIONAL: No fever, fatigue or weakness.  EYES: No blurred or double vision.  EARS, NOSE, AND THROAT: No tinnitus or ear pain.  RESPIRATORY: No cough, shortness of breath, wheezing or hemoptysis.  CARDIOVASCULAR: No chest pain, orthopnea, edema.  GASTROINTESTINAL: No nausea, vomiting, diarrhea or abdominal pain.  GENITOURINARY: No dysuria, hematuria.  ENDOCRINE: No polyuria, nocturia,  HEMATOLOGY: No anemia, easy bruising or bleeding SKIN: No rash or lesion. MUSCULOSKELETAL: No joint pain or arthritis.   NEUROLOGIC: No tingling, numbness, weakness.  PSYCHIATRY: No anxiety or depression.   DRUG ALLERGIES:  No Known Allergies  VITALS:  Blood pressure 131/82, pulse 81, temperature 98.7 F (37.1 C), temperature source Oral, resp. rate 18, height 5\' 11"  (1.803 m), weight 81.909 kg (180 lb 9.2 oz), SpO2 96 %.  PHYSICAL EXAMINATION:  GENERAL:  69 y.o.-year-old patient lying in the bed with no acute distress.  EYES: Pupils equal, round, reactive to light and accommodation. No scleral icterus. Extraocular muscles intact.  HEENT: Head atraumatic, normocephalic. Oropharynx and nasopharynx clear.  NECK:  Supple, no jugular venous distention. No thyroid enlargement, no tenderness.  LUNGS: Normal breath sounds bilaterally, no wheezing, rales,rhonchi or crepitation. No use of accessory muscles of respiration.  CARDIOVASCULAR: S1, S2 normal. No murmurs, rubs, or gallops.  ABDOMEN: Soft, nontender, nondistended. Bowel sounds present. No organomegaly or mass.  EXTREMITIES: No pedal edema, cyanosis, or clubbing.  NEUROLOGIC: Cranial nerves II through XII are intact. Muscle  strength 5/5 in all extremities. Sensation intact. Gait not checked. right second toe in dressing. PSYCHIATRIC: The patient is alert and oriented x 3.  SKIN: No obvious rash, lesion, or ulcer.    LABORATORY PANEL:   CBC  Recent Labs Lab 05/15/15 0330  WBC 6.3  HGB 13.5  HCT 38.1*  PLT 201   ------------------------------------------------------------------------------------------------------------------  Chemistries   Recent Labs Lab 05/15/15 0330  NA 140  K 3.5  CL 106  CO2 27  GLUCOSE 67  BUN 8  CREATININE 0.60*  CALCIUM 8.8*  AST 17  ALT 11*  ALKPHOS 87  BILITOT 1.1   ------------------------------------------------------------------------------------------------------------------  Cardiac Enzymes No results for input(s): TROPONINI in the last 168 hours. ------------------------------------------------------------------------------------------------------------------  RADIOLOGY:  Ct Tibia Fibula Right W Contrast  05/14/2015  CLINICAL DATA:  Redness. EXAM: CT OF THE LOWER RIGHT EXTREMITY WITH CONTRAST TECHNIQUE: Multidetector CT imaging of the right lower leg was performed according to the standard protocol following intravenous contrast administration. COMPARISON:  None. CONTRAST:  158mL OMNIPAQUE IOHEXOL 300 MG/ML  SOLN FINDINGS: Vascular calcifications are noted. No fracture or dislocation is noted. No lytic destruction is noted to suggest osteomyelitis. Degenerative changes are seen involving the first metatarsophalangeal joint. No abnormal fluid collection is noted. Mild stranding of subcutaneous tissues is noted concerning for edema or inflammation. IMPRESSION: No fracture or dislocation is seen involving the right tibia or fibula. No lytic destruction is seen to suggest osteomyelitis. Mild stranding of subcutaneous tissues in distal lower leg are noted concerning for edema or inflammation. Electronically Signed   By: Marijo Conception, M.D.   On: 05/14/2015 15:21    Dg Foot Complete Right  05/14/2015  CLINICAL DATA:  Ulcerated wound second toe. Pain and redness distal foot EXAM: RIGHT FOOT COMPLETE - 3+ VIEW COMPARISON:  None. FINDINGS: Frontal, oblique, and lateral views were obtained. There is no demonstrable fracture or dislocation. There is no erosive change or bony destruction. No soft tissue abscess is appreciable. There is bony overgrowth of the distal first metatarsal with bunion formation. There is hallux valgus deformity at the first MTP joint. There is osteoarthritic change in the first MTP joint. There is also osteoarthritic change in the medial cuneiform -first metatarsal joint. There is mild spurring in the dorsal midfoot. There are spurs arising from the posterior inferior calcaneus. 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 Hallux valgus deformity at the first MTP joint with bunion formation in this area. No fracture or dislocation. No erosive change or bony destruction. No demonstrable soft tissue abscess. There is soft tissue swelling over the dorsal forefoot. Electronically Signed   By: Lowella Grip III M.D.   On: 05/14/2015 13:18    EKG:  No orders found for this or any previous visit.  ASSESSMENT AND PLAN:   Cellulitis and abscess of right second toe. Status post Excisional debridement. Continue Zosyn and discontinue vancomycin. Wound care. As time there is no radiographic findings of osteomyelitis on either CT or x-ray imaging per Dr. Vickki Muff. Follow up wound culture: HEAVY GROWTH GROUP B STREP(S.AGALACTIAE)ISOLATED.  Diabetes. Continue sliding scale. Metformin and glipizide. Hypertension. Continue Coreg.   All the records are reviewed and case discussed with Care Management/Social Workerr. Management plans discussed with the patient, his daughter and they are in agreement.  CODE STATUS: Full code  TOTAL TIME TAKING CARE OF THIS  PATIENT: 37 minutes.  Greater than 50% time was spent on coordination of care and face-to-face counseling.  POSSIBLE D/C IN 2 DAYS, DEPENDING ON CLINICAL CONDITION.   Demetrios Loll M.D on 05/15/2015 at 4:35 PM  Between 7am to 6pm - Pager - [phone]  After 6pm go to [email or web address] - password [email or web address] [place] [facility]  Office  [phone]  CC: Primary care physician; [facility]

## 2015-05-15 NOTE — Progress Notes (Signed)
Wound culture consistent with beta-hemolytic strep.  Suspect can be d/c'd on po antibiotics.  Recommend Augmentin for broad spectrum.  F/U with me in outpt clinic next week.  Dressing can be changed daily with antibiotic ointment and gauze dressing.

## 2015-05-16 LAB — CREATININE, SERUM
CREATININE: 0.74 mg/dL (ref 0.61–1.24)
GFR calc non Af Amer: >60 mL/min (ref >60)

## 2015-05-16 LAB — GLUCOSE, CAPILLARY: GLUCOSE-CAPILLARY: 96 mg/dL (ref 65–99)

## 2015-05-16 MED ORDER — AMOXICILLIN-POT CLAVULANATE 875-125 MG PO TABS: 1.0000 | ORAL_TABLET | Freq: Two times a day (BID) | ORAL | Status: AC

## 2015-05-16 NOTE — Discharge Instructions (Signed)
Heart healthy and ADA diet. Activity as tolerated. Wound care and dressing change.

## 2015-05-16 NOTE — Progress Notes (Signed)
Discharged home; instructions/prescriptions given; acknowledged understanding.

## 2015-05-16 NOTE — Discharge Summary (Signed)
[facility] [place] NAME: Cory Anderson    MR#:  [id number]  DATE OF BIRTH:  February 22, 1946  DATE OF ADMISSION:  05/14/2015 ADMITTING PHYSICIAN: Idelle Crouch, MD  DATE OF DISCHARGE: 05/16/2015  PRIMARY CARE PHYSICIAN: [facility]    ADMISSION DIAGNOSIS:  Cellulitis in diabetic foot ([place]) [E13.628, [id number] Redness [L53.9]   DISCHARGE DIAGNOSIS:   Cellulitis and abscess of right second toe. SECONDARY DIAGNOSIS:   Past Medical History  Diagnosis Date  . Diabetes ([place])   . Hypertension   . Prostate cancer [facility])     HOSPITAL COURSE:   Cellulitis and abscess of right second toe. Status post Excisional debridement. Treated with Zosyn and vancomycin. Wound care. As time there is no radiographic findings of osteomyelitis on either CT or x-ray imaging per Dr. Vickki Muff. Follow up wound culture: HEAVY GROWTH GROUP B STREP(S.AGALACTIAE)ISOLATED. Will change to augmentin for 12 more days. Follow up Dr. Vickki Muff. Diabetes. On sliding scale. continue Metformin and glipizide. Hypertension. Continue hypertension medications.  DISCHARGE CONDITIONS:   Stable, discharge to home today.  CONSULTS OBTAINED:  Treatment Team:  Samara Deist, DPM  DRUG ALLERGIES:  No Known Allergies  DISCHARGE MEDICATIONS:   Current Discharge Medication List    START taking these medications   Details  amoxicillin-clavulanate (AUGMENTIN) 875-125 MG tablet Take 1 tablet by mouth 2 (two) times daily. Qty: 24 tablet, Refills: 0      CONTINUE these medications which have NOT CHANGED   Details  amLODipine (NORVASC) 10 MG tablet Take 10 mg by mouth daily.    benazepril (LOTENSIN) 40 MG tablet Take 40 mg by mouth daily.    carvedilol (COREG) 25 MG tablet Take 25 mg by mouth 2 (two) times daily with a meal.    glipiZIDE (GLUCOTROL) 10 MG tablet Take 10 mg by mouth daily before breakfast.    hydrALAZINE (APRESOLINE) 50 MG tablet Take 50  mg by mouth 3 (three) times daily.    metFORMIN (GLUCOPHAGE) 500 MG tablet Take 500 mg by mouth 2 (two) times daily with a meal.    metformin (FORTAMET) 1000 MG (OSM) 24 hr tablet Take 1,000 mg by mouth daily with breakfast.         DISCHARGE INSTRUCTIONS:    If you experience worsening of your admission symptoms, develop shortness of breath, life threatening emergency, suicidal or homicidal thoughts you must seek medical attention immediately by calling 911 or calling your MD immediately  if symptoms less severe.  You Must read complete instructions/literature along with all the possible adverse reactions/side effects for all the Medicines you take and that have been prescribed to you. Take any new Medicines after you have completely understood and accept all the possible adverse reactions/side effects.   Please note  You were cared for by a hospitalist during your hospital stay. If you have any questions about your discharge medications or the care you received while you were in the hospital after you are discharged, you can call the unit and asked to speak with the hospitalist on call if the hospitalist that took care of you is not available. Once you are discharged, your primary care physician will handle any further medical issues. Please note that NO REFILLS for any discharge medications will be authorized once you are discharged, as it is imperative that you return to your primary care physician (or establish a relationship with a primary care physician if you do not have one) for your  aftercare needs so that they can reassess your need for medications and monitor your lab values.    Today   SUBJECTIVE      VITAL SIGNS:  Blood pressure 145/81, pulse 36, temperature 98.7 F (37.1 C), temperature source Oral, resp. rate 18, height 5\' 11"  (1.803 m), weight 81.874 kg (180 lb 8 oz), SpO2 96 %.  I/O:   Intake/Output Summary (Last 24 hours) at 05/16/15 1006 Last data filed at  05/16/15 0316  Gross per 24 hour  Intake    590 ml  Output    700 ml  Net   -110 ml    PHYSICAL EXAMINATION:  GENERAL:  69 y.o.-year-old patient lying in the bed with no acute distress.  EYES: Pupils equal, round, reactive to light and accommodation. No scleral icterus. Extraocular muscles intact.  HEENT: Head atraumatic, normocephalic. Oropharynx and nasopharynx clear.  NECK:  Supple, no jugular venous distention. No thyroid enlargement, no tenderness.  LUNGS: Normal breath sounds bilaterally, no wheezing, rales,rhonchi or crepitation. No use of accessory muscles of respiration.  CARDIOVASCULAR: S1, S2 normal. No murmurs, rubs, or gallops.  ABDOMEN: Soft, non-tender, non-distended. Bowel sounds present. No organomegaly or mass.  EXTREMITIES: No pedal edema, cyanosis, or clubbing.  Right 2nd toe in dressing. NEUROLOGIC: Cranial nerves II through XII are intact. Muscle strength 5/5 in all extremities. Sensation intact. Gait not checked.  PSYCHIATRIC: The patient is alert and oriented x 3.  SKIN: No obvious rash, lesion, or ulcer.   DATA REVIEW:   CBC  Recent Labs Lab 05/15/15 0330  WBC 6.3  HGB 13.5  HCT 38.1*  PLT 201    Chemistries   Recent Labs Lab 05/15/15 0330 05/16/15 0541  NA 140  --   K 3.5  --   CL 106  --   CO2 27  --   GLUCOSE 67  --   BUN 8  --   CREATININE 0.60* 0.74  CALCIUM 8.8*  --   AST 17  --   ALT 11*  --   ALKPHOS 87  --   BILITOT 1.1  --     Cardiac Enzymes No results for input(s): TROPONINI in the last 168 hours.  Microbiology Results  Results for orders placed or performed during the hospital encounter of 05/14/15  Culture, blood (routine x 2)     Status: None (Preliminary result)   Collection Time: 05/14/15 12:42 PM  Result Value Ref Range Status   Specimen Description BLOOD LEFT WRIST  Final   Special Requests BOTTLES DRAWN AEROBIC AND ANAEROBIC  1CC  Final   Culture NO GROWTH 2 DAYS  Final   Report Status PENDING  Incomplete   Culture, blood (routine x 2)     Status: None (Preliminary result)   Collection Time: 05/14/15 12:50 PM  Result Value Ref Range Status   Specimen Description BLOOD RIGHT WRIST  Final   Special Requests BOTTLES DRAWN AEROBIC AND ANAEROBIC  2CC  Final   Culture NO GROWTH 2 DAYS  Final   Report Status PENDING  Incomplete  Wound culture     Status: None (Preliminary result)   Collection Time: 05/14/15  7:50 PM  Result Value Ref Range Status   Specimen Description WOUND  Final   Special Requests NONE  Final   Gram Stain RARE WBC SEEN FEW GRAM POSITIVE COCCI IN PAIRS   Final   Culture   Final    HEAVY GROWTH GROUP B STREP(S.AGALACTIAE)ISOLATED There is no known Penicillin Resistant Beta  Streptococcus in the [place] For patients that are Penicillin-allergic, Erythromycin is 85-94% susceptible, and Clindamycin is 80% susceptible.  Contact Microbiology within 7 days if sensitivity testing is  required.   Virtually 100% of S. agalactiae (Group B) strains are susceptible to Penicillin.  For Penicillin-allergic patients, Erythromycin (85-95% sensitive) and Clindamycin (80% sensitive) are drugs of choice. Contact microbiology lab to request sensitivities if  needed within 7 days.    Report Status PENDING  Incomplete    RADIOLOGY:  Ct Tibia Fibula Right W Contrast  05/14/2015  CLINICAL DATA:  Redness. EXAM: CT OF THE LOWER RIGHT EXTREMITY WITH CONTRAST TECHNIQUE: Multidetector CT imaging of the right lower leg was performed according to the standard protocol following intravenous contrast administration. COMPARISON:  None. CONTRAST:  149mL OMNIPAQUE IOHEXOL 300 MG/ML  SOLN FINDINGS: Vascular calcifications are noted. No fracture or dislocation is noted. No lytic destruction is noted to suggest osteomyelitis. Degenerative changes are seen involving the first metatarsophalangeal joint. No abnormal fluid collection is noted. Mild stranding of subcutaneous tissues is noted concerning for edema or  inflammation. IMPRESSION: No fracture or dislocation is seen involving the right tibia or fibula. No lytic destruction is seen to suggest osteomyelitis. Mild stranding of subcutaneous tissues in distal lower leg are noted concerning for edema or inflammation. Electronically Signed   By: Marijo Conception, M.D.   On: 05/14/2015 15:21   Dg Foot Complete Right  05/14/2015  CLINICAL DATA:  Ulcerated wound second toe. Pain and redness distal foot EXAM: RIGHT FOOT COMPLETE - 3+ VIEW COMPARISON:  None. FINDINGS: Frontal, oblique, and lateral views were obtained. There is no demonstrable fracture or dislocation. There is no erosive change or bony destruction. No soft tissue abscess is appreciable. There is bony overgrowth of the distal first metatarsal with bunion formation. There is hallux valgus deformity at the first MTP joint. There is osteoarthritic change in the first MTP joint. There is also osteoarthritic change in the medial cuneiform -first metatarsal joint. There is mild spurring in the dorsal midfoot. There are spurs arising from the posterior inferior calcaneus. 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 Hallux valgus deformity at the first MTP joint with bunion formation in this area. No fracture or dislocation. No erosive change or bony destruction. No demonstrable soft tissue abscess. There is soft tissue swelling over the dorsal forefoot. Electronically Signed   By: Lowella Grip III M.D.   On: 05/14/2015 13:18        Management plans discussed with the patient, family and they are in agreement.  CODE STATUS:     Code Status Orders        Start     Ordered   05/14/15 1702  Full code   Continuous     05/14/15 1701      TOTAL TIME TAKING CARE OF THIS PATIENT: 33 minutes.    Demetrios Loll M.D on 05/16/2015 at 10:06 AM  Between 7am to 6pm - Pager - [phone]  After 6pm go to  [email or web address] - password [email or web address] [facility]  Office  [phone]  CC: Primary care physician; [facility]

## 2015-05-17 LAB — WOUND CULTURE

## 2015-05-19 LAB — CULTURE, BLOOD (ROUTINE X 2)
CULTURE: NO GROWTH
CULTURE: NO GROWTH

## 2016-04-20 IMAGING — CT CT TIBIA FIBULA *R* W/ CM
3 of 4 series · 10 of 35 positions shown, 11 images · IV contrast (agent unspecified)
Comparison: None.

CONTRAST:  100mL OMNIPAQUE IOHEXOL 300 MG/ML  SOLN

CLINICAL DATA: Redness.

EXAM:
CT OF THE LOWER RIGHT EXTREMITY WITH CONTRAST
TECHNIQUE: Multidetector CT imaging of the right lower leg was performed
according to the standard protocol following intravenous contrast
administration.

[Series 2: knee · axial · 0.55mm/px · z∈[+229,+476]mm · 2 of 387 slices shown, 3 images]
[im 111/387  soft-tissue]
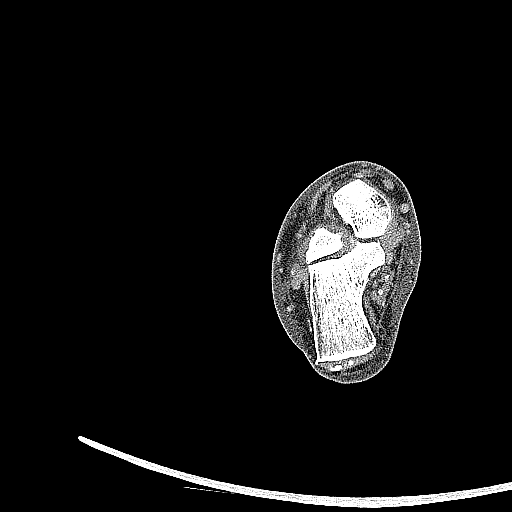
[im 111/387  bone]
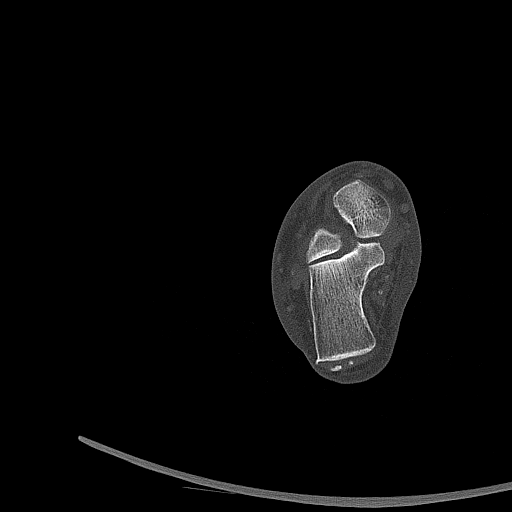
[im 276/387  bone]
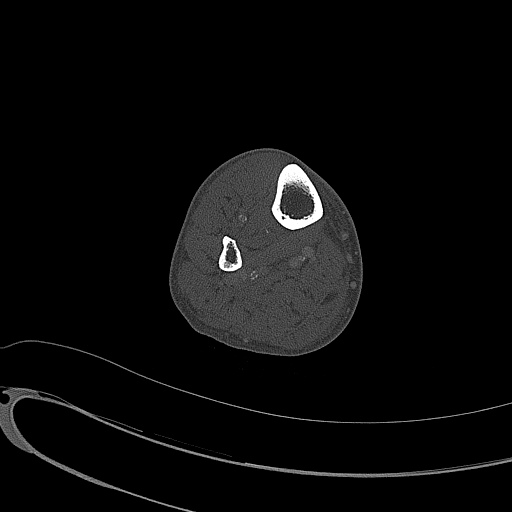

[Series 12: cor st · coronal · 0.46mm/px · 3 of 109 slices shown]
[im 22/109  bone]
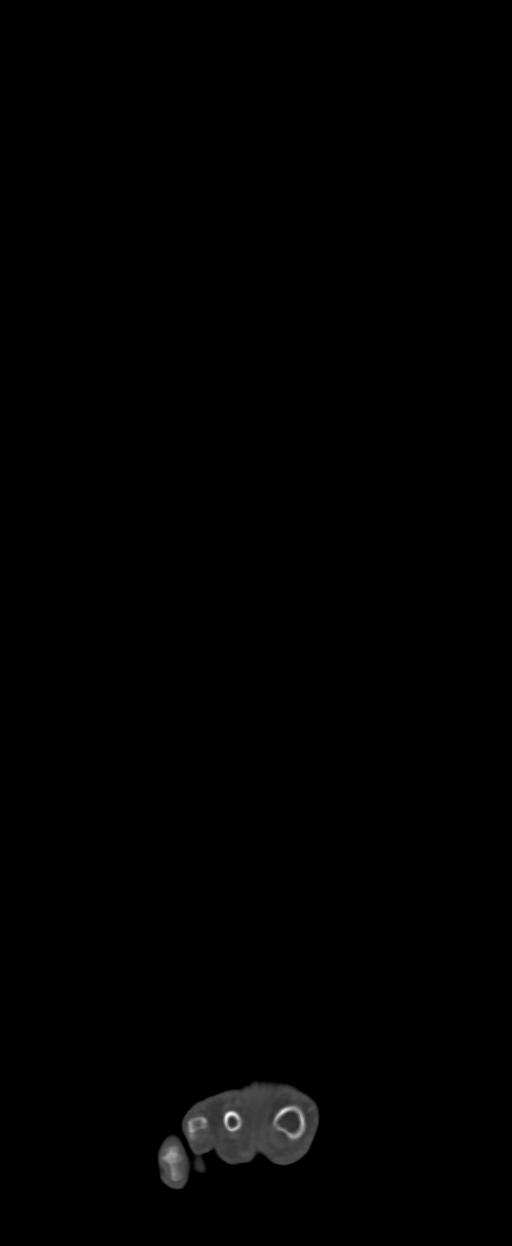
[im 44/109  bone]
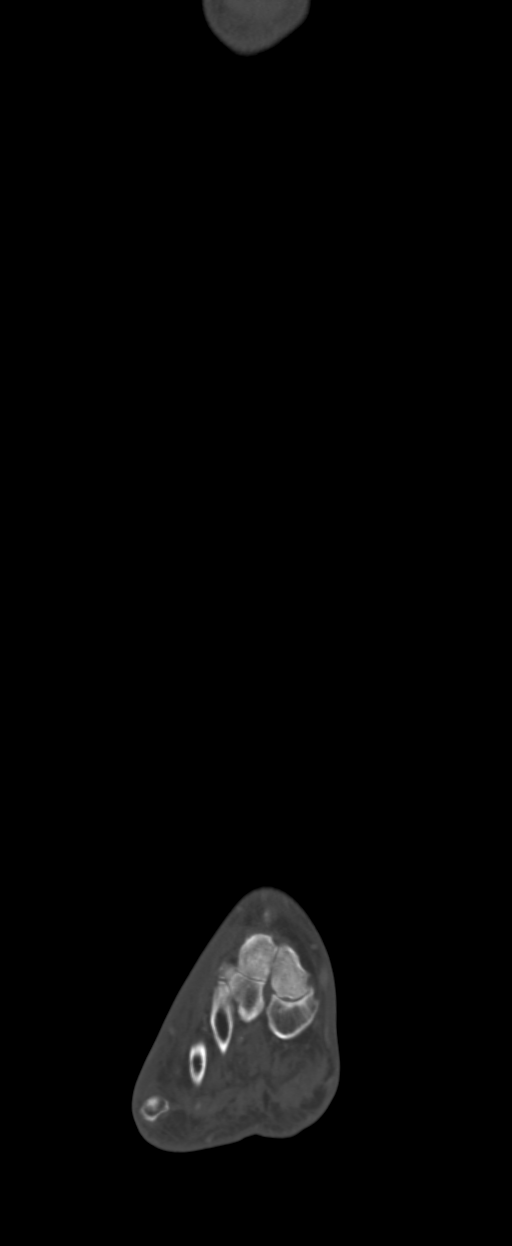
[im 65/109  bone]
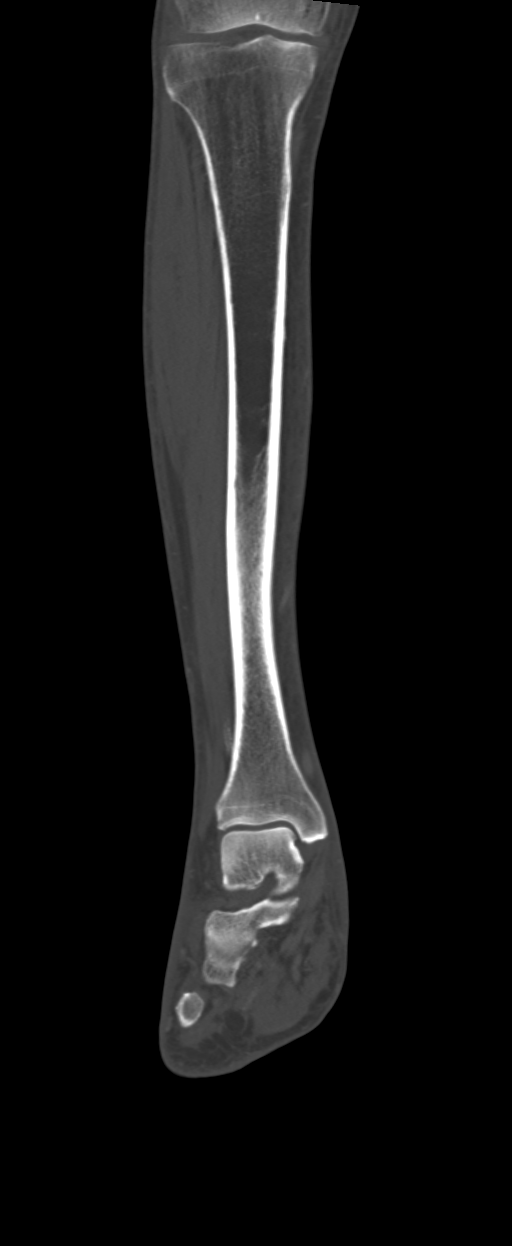

[Series 13: sag st · sagittal · 0.55mm/px · 5 of 56 slices shown]
[im 19/56  bone]
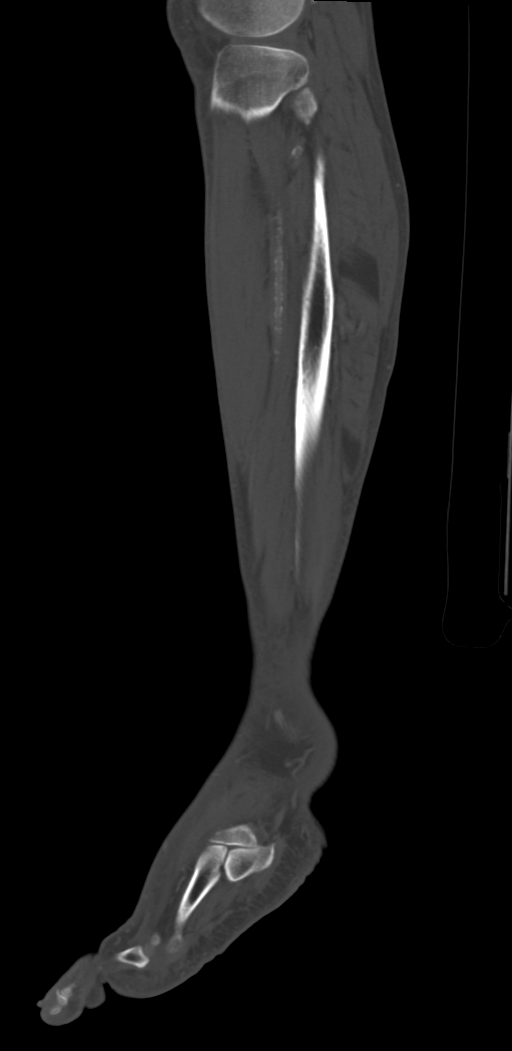
[im 23/56  bone]
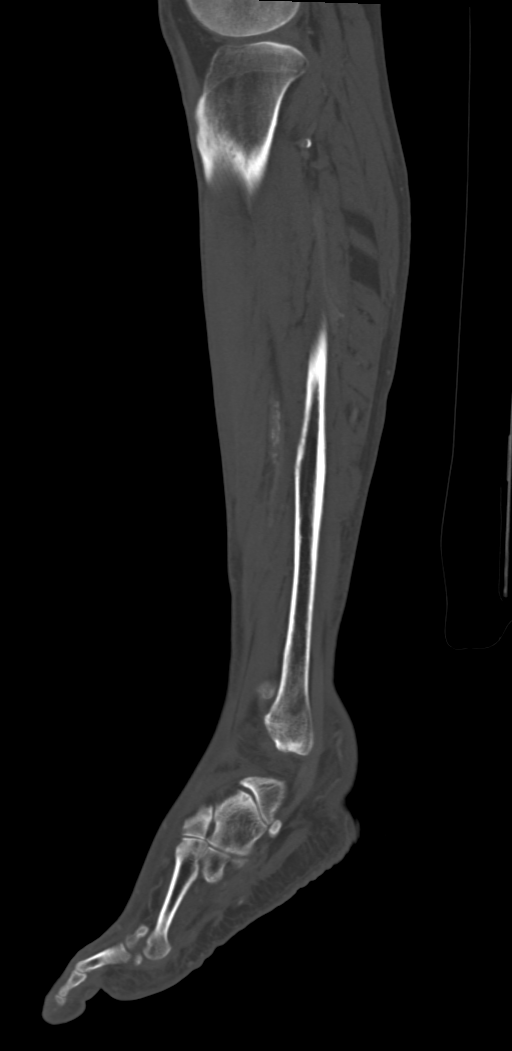
[im 28/56  bone]
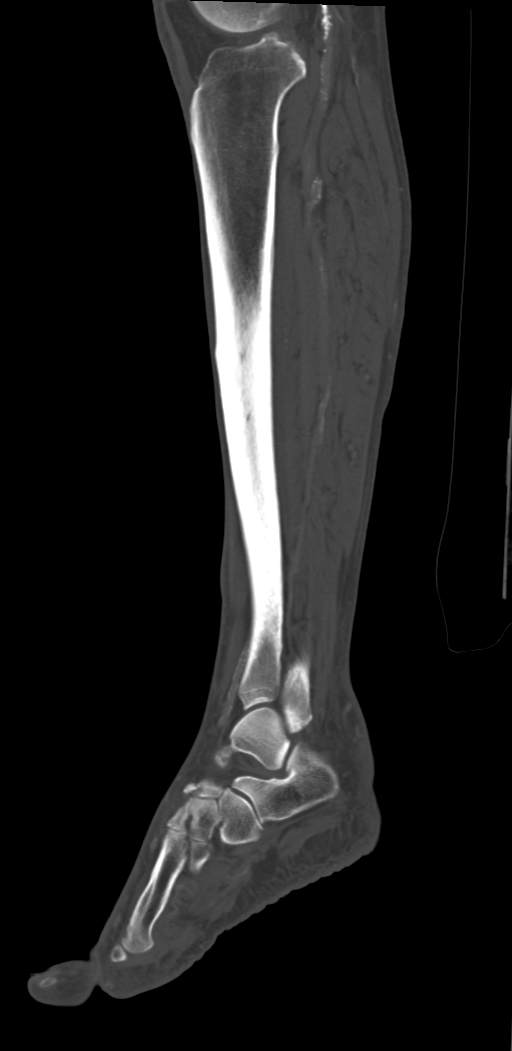
[im 33/56  bone]
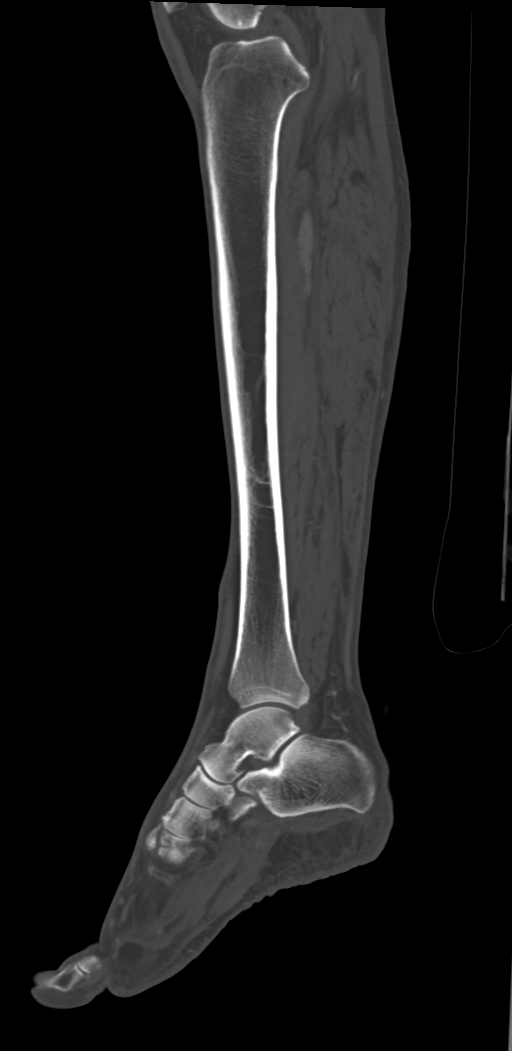
[im 37/56  bone]
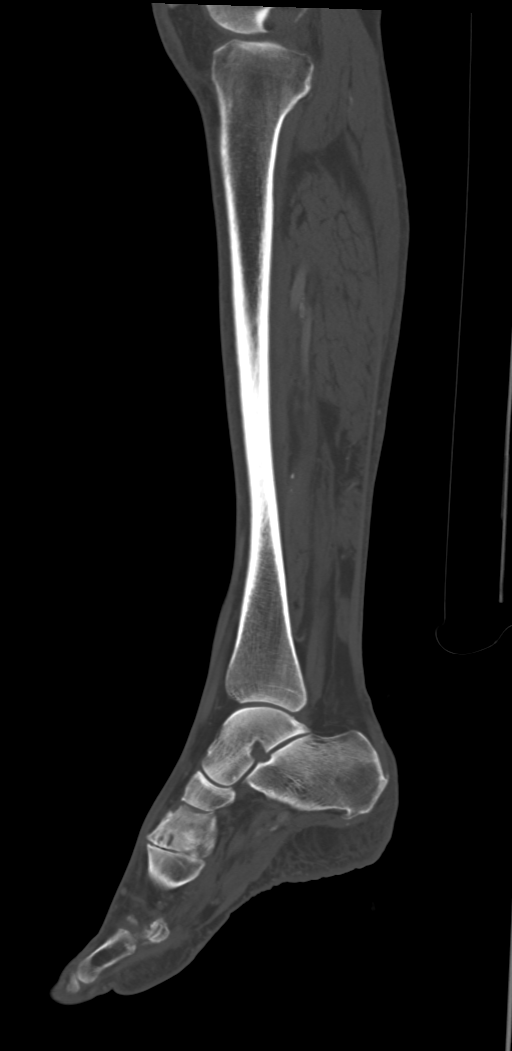

[10 of 35 positions shown; findings below may reference images not displayed]

FINDINGS: Vascular calcifications are noted. No fracture or dislocation is
noted. No lytic destruction is noted to suggest osteomyelitis.
Degenerative changes are seen involving the first
metatarsophalangeal joint. No abnormal fluid collection is noted.
Mild stranding of subcutaneous tissues is noted concerning for edema
or inflammation.
IMPRESSION: No fracture or dislocation is seen involving the right tibia or
fibula. No lytic destruction is seen to suggest osteomyelitis. Mild
stranding of subcutaneous tissues in distal lower leg are noted
concerning for edema or inflammation.

## 2016-04-20 IMAGING — CR DG FOOT COMPLETE 3+V*R*
1 series · 3 of 3 positions shown · non-contrast
Comparison: None.

CLINICAL DATA: Ulcerated wound second toe. Pain and redness distal
foot

EXAM:
RIGHT FOOT COMPLETE - 3+ VIEW

[Series 1: ap · 0.17mm/px · 3 of 3 slices shown]
[im 1/3]
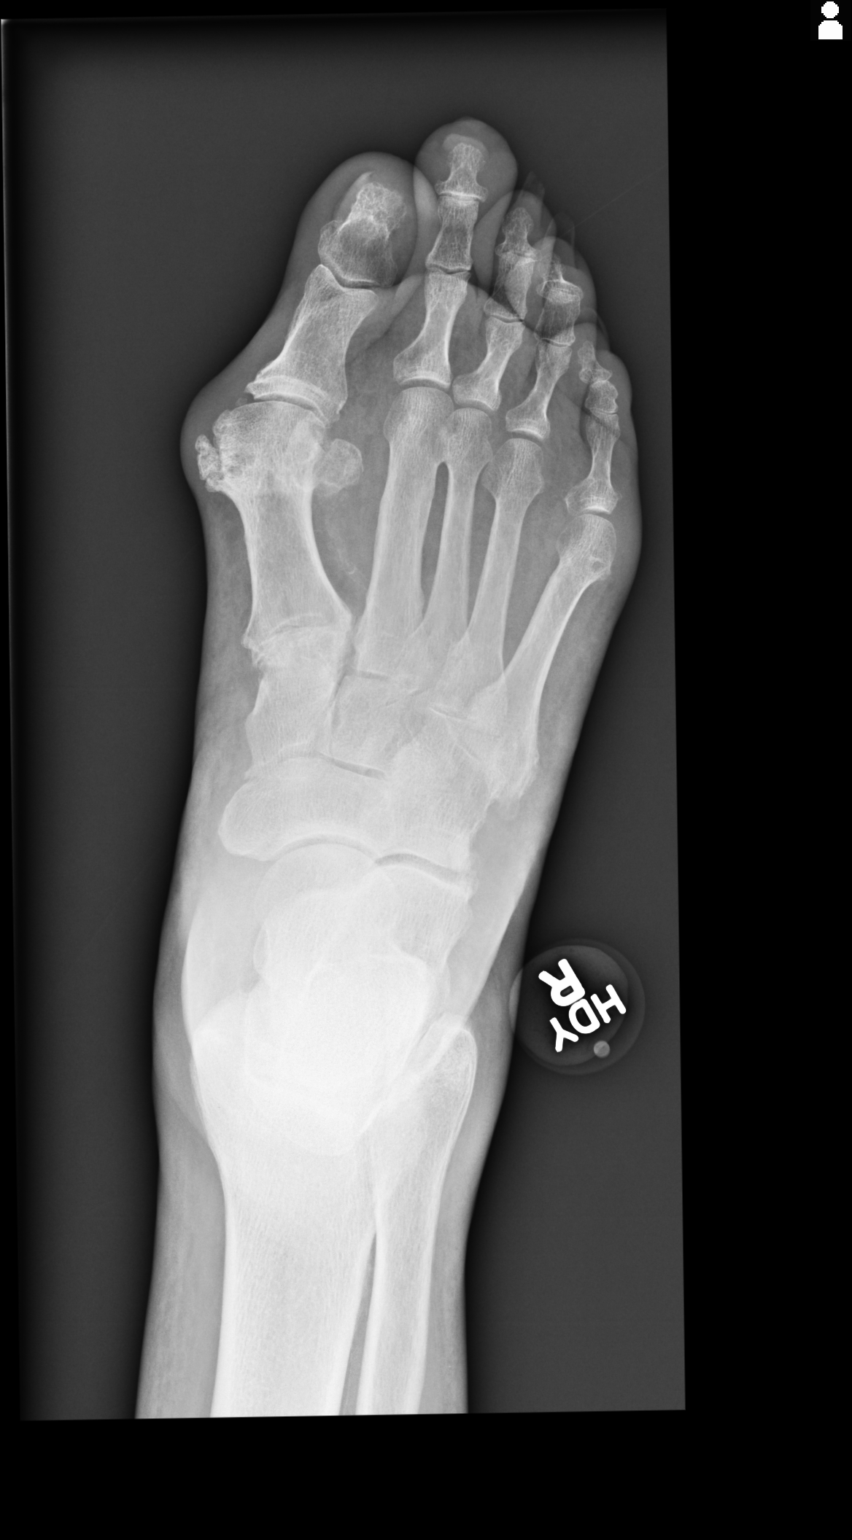
[im 2/3]
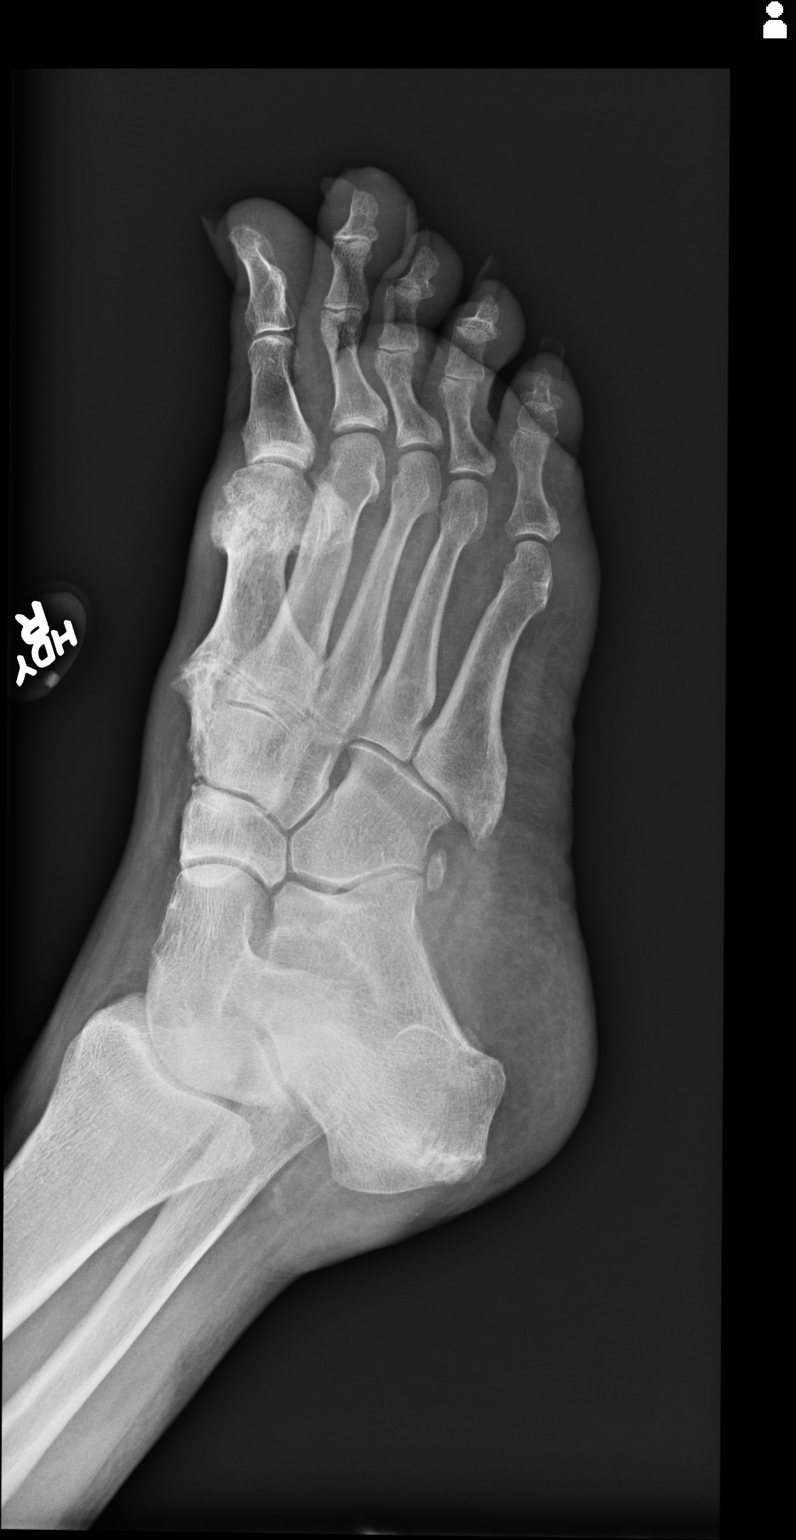
[im 3/3]
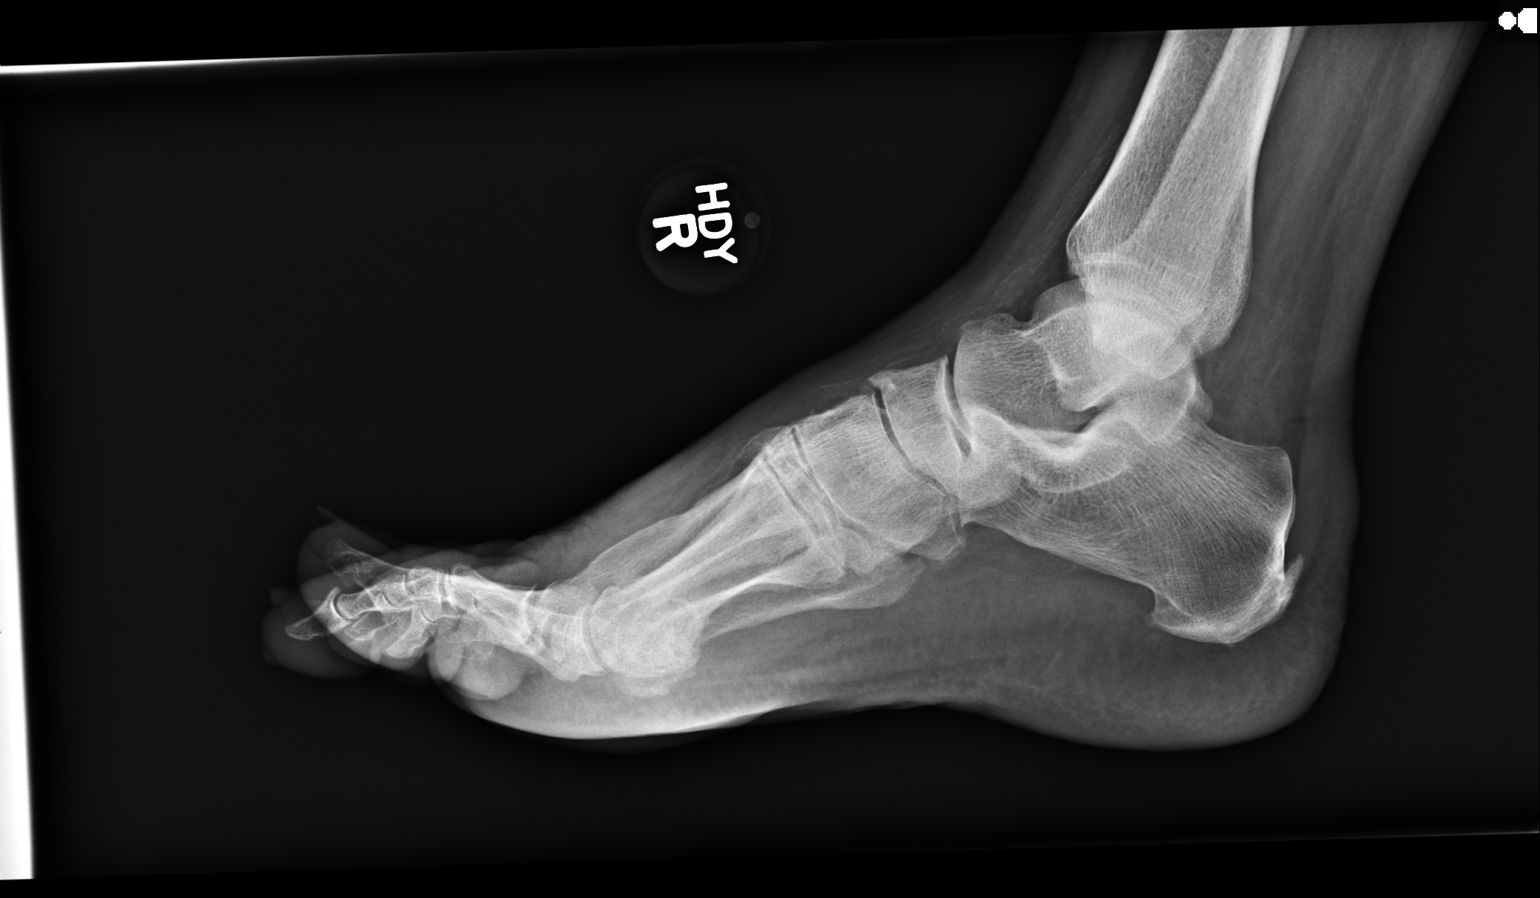

[3 of 3 positions shown; findings below may reference images not displayed]

FINDINGS: Frontal, oblique, and lateral views were obtained. There is no
demonstrable fracture or dislocation. There is no erosive change or
bony destruction. No soft tissue abscess is appreciable.

There is bony overgrowth of the distal first metatarsal with bunion
formation. There is hallux valgus deformity at the first MTP joint.
There is osteoarthritic change in the first MTP joint. There is also
osteoarthritic change in the medial cuneiform -first metatarsal
joint. There is mild spurring in the dorsal midfoot. There are spurs
arising from the posterior inferior calcaneus.
IMPRESSION: Areas of osteoarthritic change, primarily in the first MTP joint as
well as in the medial cuneiform -first metatarsal joint. Hallux
valgus deformity at the first MTP joint with bunion formation in
this area.

No fracture or dislocation. No erosive change or bony destruction.
No demonstrable soft tissue abscess. There is soft tissue swelling
over the dorsal forefoot.

## 2020-09-12 ENCOUNTER — Encounter: Payer: Self-pay | Admitting: *Deleted

## 2020-09-22 ENCOUNTER — Other Ambulatory Visit: Payer: Self-pay

## 2021-08-09 ENCOUNTER — Encounter (INDEPENDENT_AMBULATORY_CARE_PROVIDER_SITE_OTHER): Payer: Self-pay | Admitting: Vascular Surgery

## 2021-08-09 ENCOUNTER — Encounter (INDEPENDENT_AMBULATORY_CARE_PROVIDER_SITE_OTHER): Payer: Self-pay

## 2021-08-09 ENCOUNTER — Other Ambulatory Visit (INDEPENDENT_AMBULATORY_CARE_PROVIDER_SITE_OTHER): Payer: Self-pay | Admitting: Vascular Surgery

## 2021-08-09 DIAGNOSIS — L97411 Non-pressure chronic ulcer of right heel and midfoot limited to breakdown of skin: Secondary | ICD-10-CM

## 2021-08-09 DIAGNOSIS — I739 Peripheral vascular disease, unspecified: Secondary | ICD-10-CM
